# Patient Record
Sex: Male | Born: 1950 | Race: Black or African American | Hispanic: No | Marital: Married | Smoking: Never smoker
Health system: Southern US, Community
[De-identification: ages and names within clinical notes are randomized; demographics above are authoritative.]

## PROBLEM LIST (undated history)

## (undated) DIAGNOSIS — F0280 Dementia in other diseases classified elsewhere without behavioral disturbance: Secondary | ICD-10-CM

## (undated) DIAGNOSIS — G309 Alzheimer's disease, unspecified: Secondary | ICD-10-CM

---

## 2015-04-28 ENCOUNTER — Encounter (HOSPITAL_COMMUNITY): Payer: Self-pay | Admitting: *Deleted

## 2015-04-28 ENCOUNTER — Emergency Department (HOSPITAL_COMMUNITY)
Admission: EM | Admit: 2015-04-28 | Discharge: 2015-04-28 | Disposition: A | Discharge status: Home / Self Care | Payer: Medicare HMO | Attending: Emergency Medicine | Admitting: Emergency Medicine

## 2015-04-28 ENCOUNTER — Emergency Department (HOSPITAL_COMMUNITY): Payer: Medicare HMO

## 2015-04-28 DIAGNOSIS — R0602 Shortness of breath: Secondary | ICD-10-CM | POA: Insufficient documentation

## 2015-04-28 DIAGNOSIS — R079 Chest pain, unspecified: Secondary | ICD-10-CM | POA: Insufficient documentation

## 2015-04-28 DIAGNOSIS — Z7982 Long term (current) use of aspirin: Secondary | ICD-10-CM | POA: Insufficient documentation

## 2015-04-28 DIAGNOSIS — Z79899 Other long term (current) drug therapy: Secondary | ICD-10-CM | POA: Insufficient documentation

## 2015-04-28 DIAGNOSIS — G309 Alzheimer's disease, unspecified: Secondary | ICD-10-CM | POA: Insufficient documentation

## 2015-04-28 DIAGNOSIS — R05 Cough: Secondary | ICD-10-CM | POA: Insufficient documentation

## 2015-04-28 HISTORY — DX: Alzheimer's disease, unspecified: G30.9

## 2015-04-28 HISTORY — DX: Dementia in other diseases classified elsewhere, unspecified severity, without behavioral disturbance, psychotic disturbance, mood disturbance, and anxiety: F02.80

## 2015-04-28 LAB — CBC
HCT: 45.4 % (ref 39.0–52.0)
HEMOGLOBIN: 15.1 g/dL (ref 13.0–17.0)
MCH: 32.4 pg (ref 26.0–34.0)
MCHC: 33.3 g/dL (ref 30.0–36.0)
MCV: 97.4 fL (ref 78.0–100.0)
PLATELETS: 169 10*3/uL (ref 150–400)
RBC: 4.66 MIL/uL (ref 4.22–5.81)
RDW: 13.2 % (ref 11.5–15.5)
WBC: 7.7 10*3/uL (ref 4.0–10.5)

## 2015-04-28 LAB — I-STAT TROPONIN, ED
TROPONIN I, POC: 0 ng/mL (ref 0.00–0.08)
Troponin i, poc: 0.01 ng/mL (ref 0.00–0.08)

## 2015-04-28 LAB — BASIC METABOLIC PANEL
ANION GAP: 7 (ref 5–15)
BUN: 14 mg/dL (ref 6–20)
CALCIUM: 9.3 mg/dL (ref 8.9–10.3)
CO2: 29 mmol/L (ref 22–32)
CREATININE: 1.13 mg/dL (ref 0.61–1.24)
Chloride: 105 mmol/L (ref 101–111)
Glucose, Bld: 81 mg/dL (ref 65–99)
Potassium: 4.2 mmol/L (ref 3.5–5.1)
SODIUM: 141 mmol/L (ref 135–145)

## 2015-04-28 NOTE — Discharge Instructions (Signed)
1. Medications: usual home medications 2. Treatment: rest, drink plenty of fluids 3. Follow Up: please followup with your primary doctor within the next week for discussion of your diagnoses and further evaluation after today's visit; if you do not have a primary care doctor use the resource guide provided to find one; please return to the ER for severe chest pain, increased shortness of breath, new or worsening symptoms   Chest Pain Observation It is often hard to give a specific diagnosis for the cause of chest pain. Among other possibilities your symptoms might be caused by inadequate oxygen delivery to your heart (angina). Angina that is not treated or evaluated can lead to a heart attack (myocardial infarction) or death. Blood tests, electrocardiograms, and X-rays may have been done to help determine a possible cause of your chest pain. After evaluation and observation, your health care provider has determined that it is unlikely your pain was caused by an unstable condition that requires hospitalization. However, a full evaluation of your pain may need to be completed, with additional diagnostic testing as directed. It is very important to keep your follow-up appointments. Not keeping your follow-up appointments could result in permanent heart damage, disability, or death. If there is any problem keeping your follow-up appointments, you must call your health care provider. HOME CARE INSTRUCTIONS  Due to the slight chance that your pain could be angina, it is important to follow your health care provider's treatment plan and also maintain a healthy lifestyle:  Maintain or work toward achieving a healthy weight.  Stay physically active and exercise regularly.  Decrease your salt intake.  Eat a balanced, healthy diet. Talk to a dietitian to learn about heart-healthy foods.  Increase your fiber intake by including whole grains, vegetables, fruits, and nuts in your diet.  Avoid situations that  cause stress, anger, or depression.  Take medicines as advised by your health care provider. Report any side effects to your health care provider. Do not stop medicines or adjust the dosages on your own.  Quit smoking. Do not use nicotine patches or gum until you check with your health care provider.  Keep your blood pressure, blood sugar, and cholesterol levels within normal limits.  Limit alcohol intake to no more than 1 drink per day for women who are not pregnant and 2 drinks per day for men.  Do not abuse drugs. SEEK IMMEDIATE MEDICAL CARE IF: You have severe chest pain or pressure which may include symptoms such as:  You feel pain or pressure in your arms, neck, jaw, or back.  You have severe back or abdominal pain, feel sick to your stomach (nauseous), or throw up (vomit).  You are sweating profusely.  You are having a fast or irregular heartbeat.  You feel short of breath while at rest.  You notice increasing shortness of breath during rest, sleep, or with activity.  You have chest pain that does not get better after rest or after taking your usual medicine.  You wake from sleep with chest pain.  You are unable to sleep because you cannot breathe.  You develop a frequent cough or you are coughing up blood.  You feel dizzy, faint, or experience extreme fatigue.  You develop severe weakness, dizziness, fainting, or chills. Any of these symptoms may represent a serious problem that is an emergency. Do not wait to see if the symptoms will go away. Call your local emergency services (911 in the [place] Do not drive yourself to the hospital.  MAKE SURE YOU:  Understand these instructions.  Will watch your condition.  Will get help right away if you are not doing well or get worse.   This information is not intended to replace advice given to you by your health care provider. Make sure you discuss any questions you have with your health care provider.   Document  Released: 06/19/2010 Document Revised: 05/22/2013 Document Reviewed: 11/16/2012 Elsevier Interactive Patient Education 2016 [employer]   Nonspecific Chest Pain  Chest pain can be caused by many different conditions. There is always a chance that your pain could be related to something serious, such as a heart attack or a blood clot in your lungs. Chest pain can also be caused by conditions that are not life-threatening. If you have chest pain, it is very important to follow up with your health care provider. CAUSES  Chest pain can be caused by:  Heartburn.  Pneumonia or bronchitis.  Anxiety or stress.  Inflammation around your heart (pericarditis) or lung (pleuritis or pleurisy).  A blood clot in your lung.  A collapsed lung (pneumothorax). It can develop suddenly on its own (spontaneous pneumothorax) or from trauma to the chest.  Shingles infection (varicella-zoster virus).  Heart attack.  Damage to the bones, muscles, and cartilage that make up your chest wall. This can include:  Bruised bones due to injury.  Strained muscles or cartilage due to frequent or repeated coughing or overwork.  Fracture to one or more ribs.  Sore cartilage due to inflammation (costochondritis). RISK FACTORS  Risk factors for chest pain may include:  Activities that increase your risk for trauma or injury to your chest.  Respiratory infections or conditions that cause frequent coughing.  Medical conditions or overeating that can cause heartburn.  Heart disease or family history of heart disease.  Conditions or health behaviors that increase your risk of developing a blood clot.  Having had chicken pox (varicella zoster). SIGNS AND SYMPTOMS Chest pain can feel like:  Burning or tingling on the surface of your chest or deep in your chest.  Crushing, pressure, aching, or squeezing pain.  Dull or sharp pain that is worse when you move, cough, or take a deep breath.  Pain that is  also felt in your back, neck, shoulder, or arm, or pain that spreads to any of these areas. Your chest pain may come and go, or it may stay constant. DIAGNOSIS Lab tests or other studies may be needed to find the cause of your pain. Your health care provider may have you take a test called an ambulatory ECG (electrocardiogram). An ECG records your heartbeat patterns at the time the test is performed. You may also have other tests, such as:  Transthoracic echocardiogram (TTE). During echocardiography, sound waves are used to create a picture of all of the heart structures and to look at how blood flows through your heart.  Transesophageal echocardiogram (TEE).This is a more advanced imaging test that obtains images from inside your body. It allows your health care provider to see your heart in finer detail.  Cardiac monitoring. This allows your health care provider to monitor your heart rate and rhythm in real time.  Holter monitor. This is a portable device that records your heartbeat and can help to diagnose abnormal heartbeats. It allows your health care provider to track your heart activity for several days, if needed.  Stress tests. These can be done through exercise or by taking medicine that makes your heart beat more quickly.  Blood tests.  Imaging tests. TREATMENT  Your treatment depends on what is causing your chest pain. Treatment may include:  Medicines. These may include:  Acid blockers for heartburn.  Anti-inflammatory medicine.  Pain medicine for inflammatory conditions.  Antibiotic medicine, if an infection is present.  Medicines to dissolve blood clots.  Medicines to treat coronary artery disease.  Supportive care for conditions that do not require medicines. This may include:  Resting.  Applying heat or cold packs to injured areas.  Limiting activities until pain decreases. HOME CARE INSTRUCTIONS  If you were prescribed an antibiotic medicine, finish it  all even if you start to feel better.  Avoid any activities that bring on chest pain.  Do not use any tobacco products, including cigarettes, chewing tobacco, or electronic cigarettes. If you need help quitting, ask your health care provider.  Do not drink alcohol.  Take medicines only as directed by your health care provider.  Keep all follow-up visits as directed by your health care provider. This is important. This includes any further testing if your chest pain does not go away.  If heartburn is the cause for your chest pain, you may be told to keep your head raised (elevated) while sleeping. This reduces the chance that acid will go from your stomach into your esophagus.  Make lifestyle changes as directed by your health care provider. These may include:  Getting regular exercise. Ask your health care provider to suggest some activities that are safe for you.  Eating a heart-healthy diet. A registered dietitian can help you to learn healthy eating options.  Maintaining a healthy weight.  Managing diabetes, if necessary.  Reducing stress. SEEK MEDICAL CARE IF:  Your chest pain does not go away after treatment.  You have a rash with blisters on your chest.  You have a fever. SEEK IMMEDIATE MEDICAL CARE IF:   Your chest pain is worse.  You have an increasing cough, or you cough up blood.  You have severe abdominal pain.  You have severe weakness.  You faint.  You have chills.  You have sudden, unexplained chest discomfort.  You have sudden, unexplained discomfort in your arms, back, neck, or jaw.  You have shortness of breath at any time.  You suddenly start to sweat, or your skin gets clammy.  You feel nauseous or you vomit.  You suddenly feel light-headed or dizzy.  Your heart begins to beat quickly, or it feels like it is skipping beats. These symptoms may represent a serious problem that is an emergency. Do not wait to see if the symptoms will go away.  Get medical help right away. Call your local emergency services (911 in the [place] Do not drive yourself to the hospital.   This information is not intended to replace advice given to you by your health care provider. Make sure you discuss any questions you have with your health care provider.   Document Released: 02/24/2005 Document Revised: 06/07/2014 Document Reviewed: 12/21/2013 Elsevier Interactive Patient Education [employer]

## 2015-04-28 NOTE — ED Provider Notes (Signed)
CSN: [id number]     Arrival date & time 04/28/15  1440 History   First MD Initiated Contact with Patient 04/28/15 1748     Chief Complaint  Patient presents with  . Chest Pain    HPI   Samuel Shields is a 64 y.o. male with a PMH of alzheimer disease who presents to the ED with chest pressure, which he states started around noon this afternoon. He reports his symptoms have been intermittent since that time, and last for several minutes. He denies exacerbating factors, and states his pain occurs both at rest and with exertion. He denies alleviating factors. He reports associated shortness of breath and nonproductive cough. He denies fever, chills, abdominal pain, nausea, vomiting, diarrhea, constipation. He states he experienced the same symptoms approximately 2 weeks ago, however was never evaluated for this. He denies recent travel or immobility, recent surgery, history of DVT or PE, history of malignancy.   Past Medical History  Diagnosis Date  . Alzheimer disease    History reviewed. No pertinent past surgical history. History reviewed. No pertinent family history. Social History  Substance Use Topics  . Smoking status: Never Smoker   . Smokeless tobacco: None  . Alcohol Use: No     Review of Systems  Constitutional: Negative for fever and chills.  HENT: Negative for congestion.   Respiratory: Positive for cough and shortness of breath.   Cardiovascular: Positive for chest pain. Negative for leg swelling.  Gastrointestinal: Negative for nausea, vomiting, abdominal pain, diarrhea and constipation.  Neurological: Negative for dizziness and light-headedness.  All other systems reviewed and are negative.     Allergies  Tetanus toxoids  Home Medications   Prior to Admission medications   Medication Sig Start Date End Date Taking? Authorizing Provider  aspirin 325 MG tablet Take 325 mg by mouth daily.   Yes Historical Provider, MD  Cholecalciferol (VITAMIN D3) 5000 UNITS  CAPS Take 1 capsule by mouth daily.   Yes Historical Provider, MD  memantine (NAMENDA XR) 28 MG CP24 24 hr capsule Take 28 mg by mouth daily.   Yes Historical Provider, MD  mirtazapine (REMERON) 45 MG tablet Take 45 mg by mouth at bedtime.   Yes Historical Provider, MD  Multiple Vitamins-Minerals (ADULT ONE DAILY GUMMIES PO) Take 2 tablets by mouth daily.   Yes Historical Provider, MD  Omega-3 Fatty Acids (FISH OIL) 1200 MG CAPS Take 1 capsule by mouth 2 (two) times daily.   Yes Historical Provider, MD  OVER THE COUNTER MEDICATION Place 1 mL under the tongue daily. Sublingual b12 5000mcg liquid   Yes Historical Provider, MD  OVER THE COUNTER MEDICATION Take 1 scoop by mouth daily. Medication: Active Steam. One scoop every day in 16 oz of water   Yes Historical Provider, MD  prednisoLONE acetate (PRED FORTE) 1 % ophthalmic suspension Place 1 drop into both eyes See admin instructions. One drop 3 times a day in the right eye, then one drop every two hours in left eye per family members   Yes Historical Provider, MD  Rivastigmine (EXELON) 13.3 MG/24HR PT24 Place 1 patch onto the skin daily.   Yes Historical Provider, MD  tamsulosin (FLOMAX) 0.4 MG CAPS capsule Take 0.4 mg by mouth daily.   Yes Historical Provider, MD  TURMERIC PO Take 10 mLs by mouth daily. Mixed with chai drink and some soy milk heated until warm to drink before going to bed   Yes Historical Provider, MD    BP 124/84 mmHg  Pulse 73  Temp(Src) 98.1 F (36.7 C) (Oral)  Resp 17  SpO2 98% Physical Exam  Constitutional: He is oriented to person, place, and time. He appears well-developed and well-nourished. No distress.  HENT:  Head: Normocephalic and atraumatic.  Right Ear: External ear normal.  Left Ear: External ear normal.  Nose: Nose normal.  Mouth/Throat: Uvula is midline, oropharynx is clear and moist and mucous membranes are normal.  Eyes: Conjunctivae, EOM and lids are normal. Pupils are equal, round, and reactive to  light. Right eye exhibits no discharge. Left eye exhibits no discharge. No scleral icterus.  Neck: Normal range of motion. Neck supple.  Cardiovascular: Normal rate, regular rhythm, normal heart sounds, intact distal pulses and normal pulses.   Pulmonary/Chest: Effort normal and breath sounds normal. No respiratory distress. He has no wheezes. He has no rales.  Abdominal: Soft. Normal appearance and bowel sounds are normal. He exhibits no distension and no mass. There is no tenderness. There is no rigidity, no rebound and no guarding.  Musculoskeletal: Normal range of motion. He exhibits no edema or tenderness.  Neurological: He is alert and oriented to person, place, and time. He has normal strength. No sensory deficit.  Skin: Skin is warm, dry and intact. No rash noted. He is not diaphoretic. No erythema. No pallor.  Psychiatric: He has a normal mood and affect. His speech is normal and behavior is normal.  Nursing note and vitals reviewed.   ED Course  Procedures (including critical care time)  Labs Review Labs Reviewed  BASIC METABOLIC PANEL  CBC  I-STAT TROPOININ, ED  Rosezena Sensor, ED    Imaging Review Dg Chest 2 View  04/28/2015  CLINICAL DATA:  Chest pain and shortness of breath for 2 days. EXAM: CHEST  2 VIEW COMPARISON:  None. FINDINGS: Trachea is midline. Heart size normal. Lungs are hyperinflated with minimal biapical pleural parenchymal scarring. No airspace consolidation or pleural fluid. EKG electrodes project over the upper hemithoraces. IMPRESSION: Hyperinflation without acute finding. Electronically Signed   By: Leanna Battles M.D.   On: 04/28/2015 15:19   I have personally reviewed and evaluated these images and lab results as part of my medical decision-making.   EKG Interpretation   Date/Time:  Monday April 28 2015 14:53:11 EST Ventricular Rate:  63 PR Interval:  186 QRS Duration: 76 QT Interval:  372 QTC Calculation: 380 R Axis:   70 Text  Interpretation:  Normal sinus rhythm Normal ECG Confirmed by Fayrene Fearing   MD, MARK ([id number]) on 04/28/2015 2:55:45 PM      MDM   Final diagnoses:  Chest pain, unspecified chest pain type    64 year old male presents with chest pressure and shortness of breath, which he states started around noon today and has been intermittent since that time. Reports associated nonproductive cough. Denies fever, chills, abdominal pain, nausea, vomiting, diarrhea, constipation, recent travel or immobility, recent surgery, history of DVT or PE, history of malignancy.  Patient is afebrile. Vital signs stable. Heart regular rate and rhythm. Lungs clear to auscultation bilaterally. Abdomen soft, nontender, nondistended. No lower extremity edema.  CBC negative for leukocytosis or anemia. BMP unremarkable. EKG normal sinus rhythm, heart rate 63. Troponin negative. Chest x-ray remarkable for hyperinflation without acute finding.  Will obtained delta troponin. Delta troponin negative.  Patient is non-toxic and well-appearing. Feel he is stable for discharge at this time. Chest pain non-exertional. Low suspicion for ACS given negative work-up and low HEART score (2-3 given age and positive family history).  No tachycardia. O2 sat 98-99% on RA. No significant risk factors for PE. Patient to follow-up with PCP (patient states he sees his PCP regularly and will be able to follow-up within the next week). Return precautions discussed at length. Patient and family verbalize understanding and are in agreement with plan. Patient discussed with Dr. Dalene Seltzer.  BP 124/84 mmHg  Pulse 73  Temp(Src) 98.1 F (36.7 C) (Oral)  Resp 17  SpO2 98%       Mady Gemma, PA-C 04/28/15 2031  Alvira Monday, MD 05/04/15 2132

## 2015-04-28 NOTE — ED Notes (Signed)
Pt reports mid chest pressure since last night and SOB. ekg done at triage and no acute distress is noted at this time.

## 2016-12-22 IMAGING — CR DG CHEST 2V
2 series · 2 of 2 positions shown · non-contrast
Comparison: None.

CLINICAL DATA: Chest pain and shortness of breath for 2 days.

EXAM:
CHEST  2 VIEW

[chest pa]
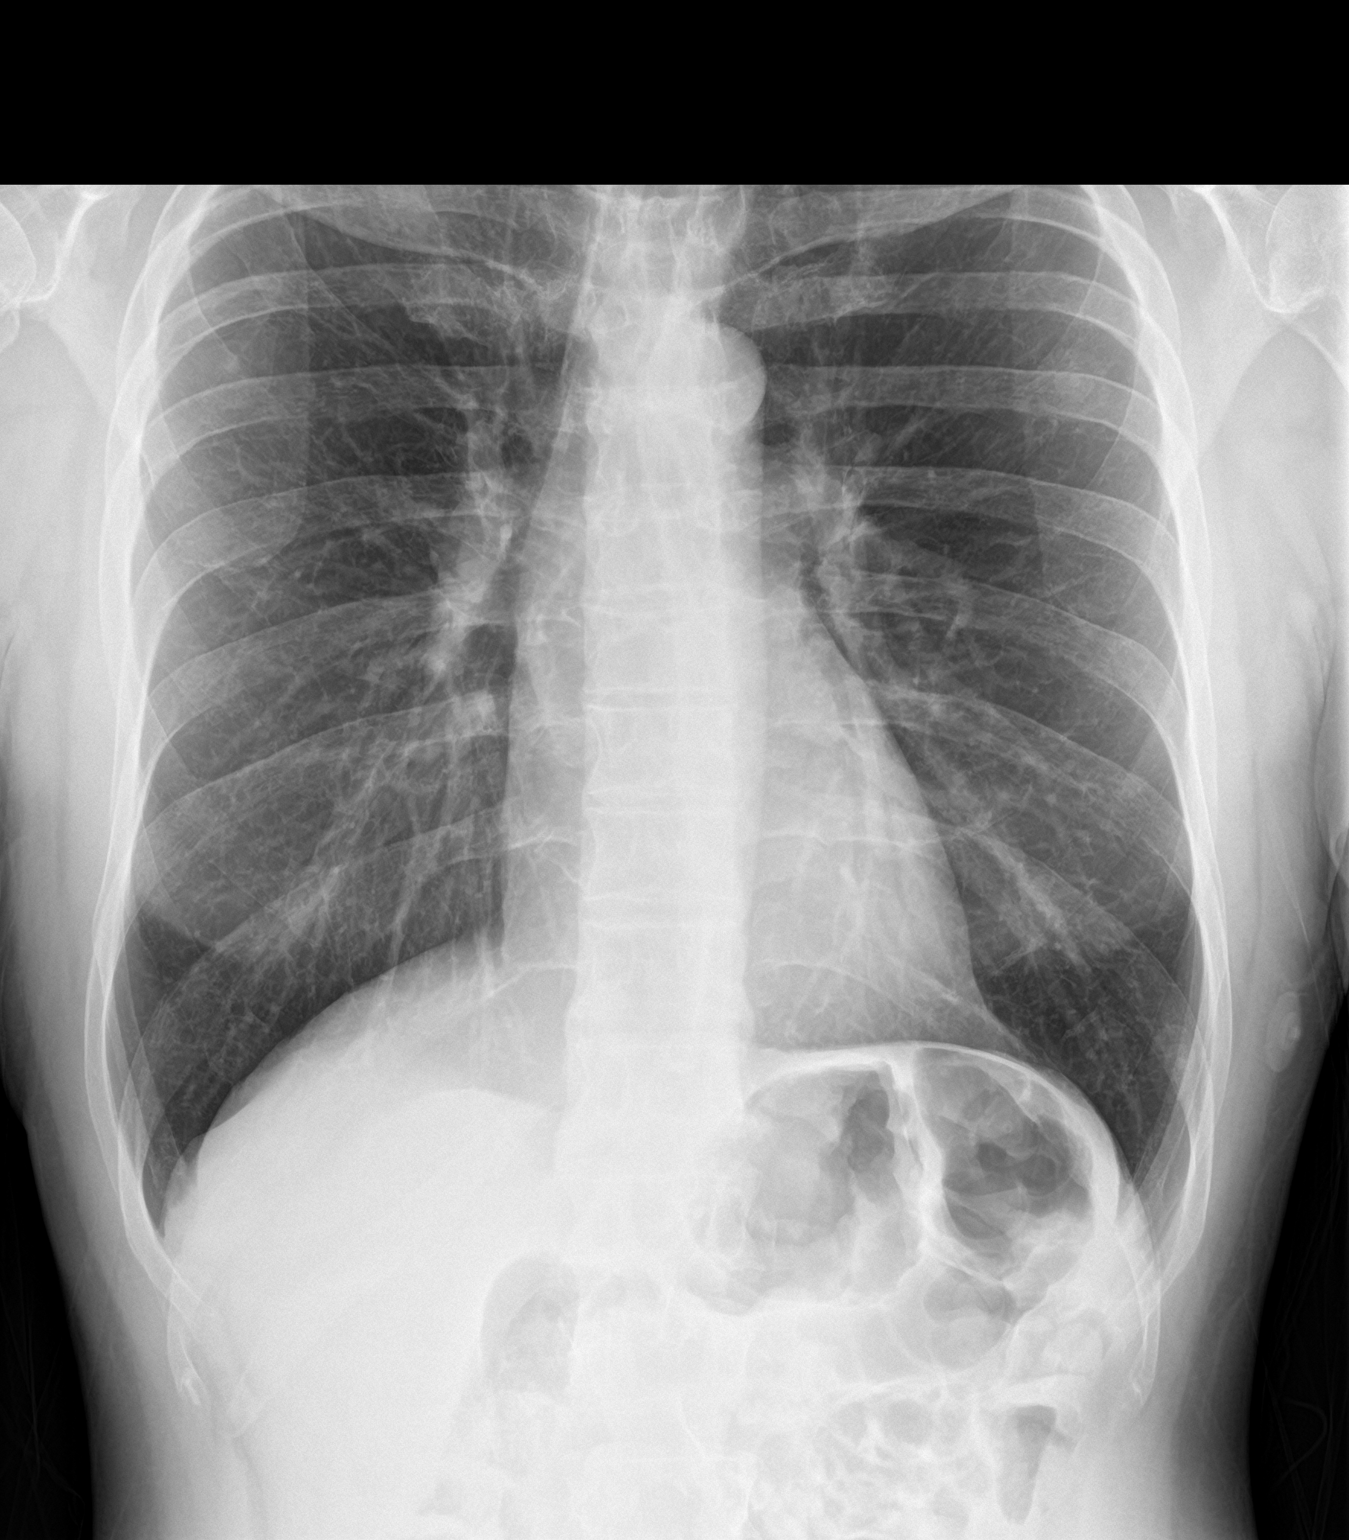

[chest lat]
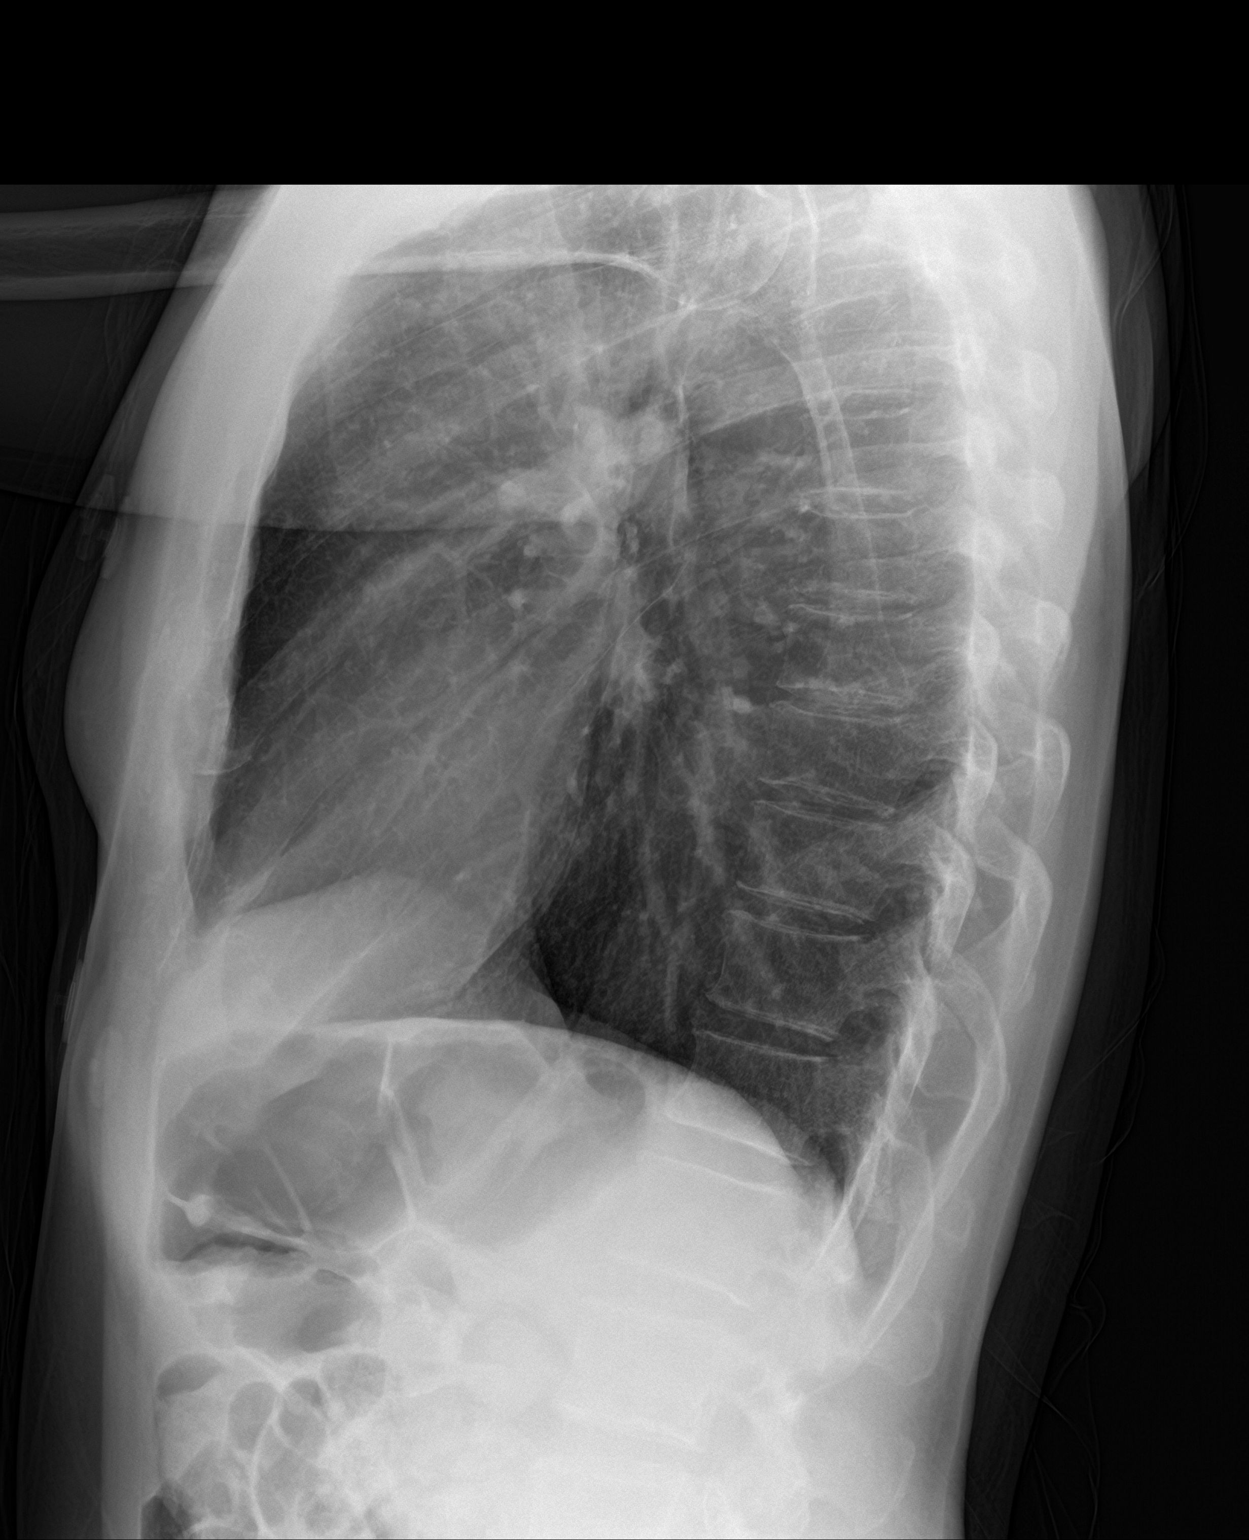

[2 of 2 positions shown; findings below may reference images not displayed]

FINDINGS: Trachea is midline. Heart size normal. Lungs are hyperinflated with
minimal biapical pleural parenchymal scarring. No airspace
consolidation or pleural fluid. EKG electrodes project over the
upper hemithoraces.
IMPRESSION: Hyperinflation without acute finding.

## 2018-04-30 DEATH — deceased
